# Patient Record
Sex: Female | Born: 1950 | Race: Black or African American | Hispanic: No
Health system: Southern US, Community
[De-identification: ages and names within clinical notes are randomized; demographics above are authoritative.]

---

## 2003-04-05 ENCOUNTER — Other Ambulatory Visit: Payer: Self-pay

## 2003-04-06 ENCOUNTER — Other Ambulatory Visit: Payer: Self-pay

## 2004-02-01 ENCOUNTER — Emergency Department: Payer: Self-pay | Admitting: Internal Medicine

## 2004-03-09 ENCOUNTER — Emergency Department: Payer: Self-pay | Admitting: Emergency Medicine

## 2004-03-09 ENCOUNTER — Other Ambulatory Visit: Payer: Self-pay

## 2004-03-10 ENCOUNTER — Ambulatory Visit: Payer: Self-pay | Admitting: Emergency Medicine

## 2004-09-10 ENCOUNTER — Emergency Department: Payer: Self-pay | Admitting: General Practice

## 2004-09-10 ENCOUNTER — Other Ambulatory Visit: Payer: Self-pay

## 2004-11-11 ENCOUNTER — Other Ambulatory Visit: Payer: Self-pay

## 2004-11-11 ENCOUNTER — Emergency Department: Payer: Self-pay | Admitting: Emergency Medicine

## 2005-04-25 ENCOUNTER — Ambulatory Visit: Payer: Self-pay | Admitting: Unknown Physician Specialty

## 2006-01-05 ENCOUNTER — Other Ambulatory Visit: Payer: Self-pay

## 2006-01-05 ENCOUNTER — Inpatient Hospital Stay: Payer: Self-pay | Admitting: Internal Medicine

## 2006-02-18 ENCOUNTER — Other Ambulatory Visit: Payer: Self-pay

## 2006-02-19 ENCOUNTER — Inpatient Hospital Stay: Payer: Self-pay | Admitting: Internal Medicine

## 2006-03-05 ENCOUNTER — Emergency Department: Payer: Self-pay | Admitting: Emergency Medicine

## 2007-04-29 IMAGING — CR DG CHEST 1V PORT
1 series · 1 of 1 positions shown · non-contrast
Comparison: none

REASON FOR EXAM: chest pain  m 13
COMMENTS:

[view not recorded]
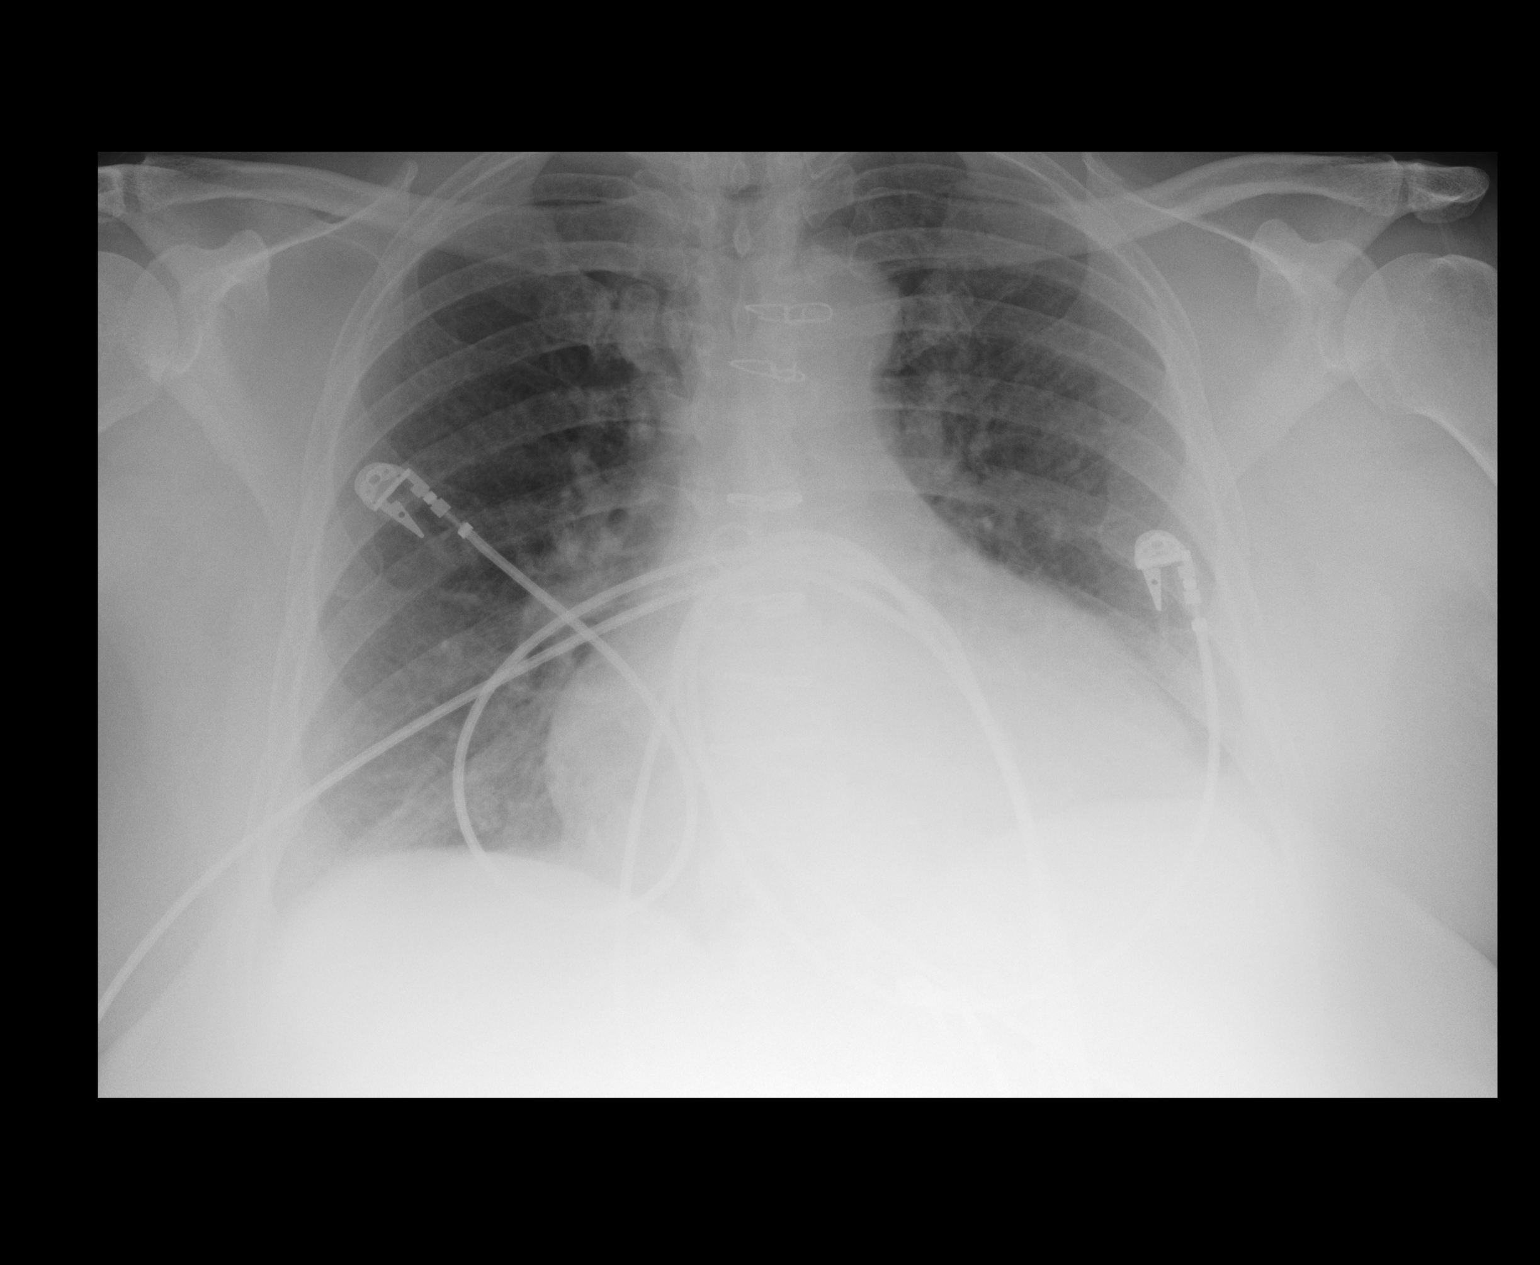

[1 of 1 positions shown; findings below may reference images not displayed]

PROCEDURE:     DXR - DXR PORTABLE CHEST SINGLE VIEW  - January 05, 2006  [DATE]

RESULT:     Comparison is made to prior study dated 11-11-04.

There is thickening and indistinctness of the interstitial markings and
pulmonary vascular as well as peribronchial cuffing.  The cardiac silhouette
is enlarged indicative of cardiomegaly. The visualized bony skeleton
demonstrates no evidence of fracture nor dislocation. The patient is status
post median sternotomy.
IMPRESSION: 1)Findings which appear to be consistent with pulmonary vascular
congestion/edema. No focal regions of consolidation are appreciated.

2)Cardiomegaly.

## 2014-08-11 ENCOUNTER — Other Ambulatory Visit: Payer: Self-pay

## 2014-08-11 ENCOUNTER — Emergency Department
Admission: EM | Admit: 2014-08-11 | Discharge: 2014-08-11 | Discharge status: Against Medical Advice | Payer: Medicare Other | Attending: Emergency Medicine | Admitting: Emergency Medicine

## 2014-08-11 DIAGNOSIS — Z880 Allergy status to penicillin: Secondary | ICD-10-CM | POA: Diagnosis not present

## 2014-08-11 DIAGNOSIS — W1839XA Other fall on same level, initial encounter: Secondary | ICD-10-CM | POA: Insufficient documentation

## 2014-08-11 DIAGNOSIS — W19XXXA Unspecified fall, initial encounter: Secondary | ICD-10-CM

## 2014-08-11 DIAGNOSIS — Y9389 Activity, other specified: Secondary | ICD-10-CM | POA: Diagnosis not present

## 2014-08-11 DIAGNOSIS — Z043 Encounter for examination and observation following other accident: Secondary | ICD-10-CM | POA: Insufficient documentation

## 2014-08-11 DIAGNOSIS — Y998 Other external cause status: Secondary | ICD-10-CM | POA: Diagnosis not present

## 2014-08-11 DIAGNOSIS — Y9289 Other specified places as the place of occurrence of the external cause: Secondary | ICD-10-CM | POA: Insufficient documentation

## 2014-08-11 LAB — TROPONIN I: Troponin I: 0.04 ng/mL — ABNORMAL HIGH (ref <0.031)

## 2014-08-11 LAB — CBC
HCT: 37.9 % (ref 35.0–47.0)
Hemoglobin: 12.4 g/dL (ref 12.0–16.0)
MCH: 30.3 pg (ref 26.0–34.0)
MCHC: 32.8 g/dL (ref 32.0–36.0)
MCV: 92.3 fL (ref 80.0–100.0)
PLATELETS: 206 10*3/uL (ref 150–440)
RBC: 4.1 MIL/uL (ref 3.80–5.20)
RDW: 17.2 % — AB (ref 11.5–14.5)
WBC: 12 10*3/uL — AB (ref 3.6–11.0)

## 2014-08-11 LAB — COMPREHENSIVE METABOLIC PANEL
ALK PHOS: 103 U/L (ref 38–126)
ALT: 26 U/L (ref 14–54)
AST: 25 U/L (ref 15–41)
Albumin: 3.7 g/dL (ref 3.5–5.0)
Anion gap: 8 (ref 5–15)
BILIRUBIN TOTAL: 0.5 mg/dL (ref 0.3–1.2)
BUN: 11 mg/dL (ref 6–20)
CO2: 28 mmol/L (ref 22–32)
Calcium: 6 mg/dL — CL (ref 8.9–10.3)
Chloride: 104 mmol/L (ref 101–111)
Creatinine, Ser: 1.16 mg/dL — ABNORMAL HIGH (ref 0.44–1.00)
GFR calc non Af Amer: 49 mL/min — ABNORMAL LOW (ref >60)
GFR, EST AFRICAN AMERICAN: 56 mL/min — AB (ref >60)
GLUCOSE: 129 mg/dL — AB (ref 65–99)
POTASSIUM: 3.4 mmol/L — AB (ref 3.5–5.1)
Sodium: 140 mmol/L (ref 135–145)
TOTAL PROTEIN: 7.7 g/dL (ref 6.5–8.1)

## 2014-08-11 NOTE — ED Notes (Signed)
Pt eloped, Dr. Deberah CastlePaduchoski notified, Dr. Demetrius CharityP called family on phone with them at this time, regarding Calcium and troponin results.   Pt states they will come back tomorrow.
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
## 2014-08-11 NOTE — ED Notes (Signed)
Lab called with critical results, Calcium 6.0, Troponin 0.04, notified Dr Clamp.

## 2014-08-11 NOTE — ED Provider Notes (Signed)
[facility] Emergency Department Provider Note  Time seen: 6:25 PM  I have reviewed the triage vital signs and the nursing notes.   HISTORY  Chief Complaint Fall    HPI Efraim KaufmannBrenda J Stirling is a 64 y.o. female who presents the emergency department after a fall. According to the patient she states she stepped outside, had felt somewhat weak and fell to the ground. According to family they state the patient tripped, and fell, but caught herself. The patient and family deny any any head injury. Patient denies any headache. Patient does take blood thinners, but denies any head impact or loss of consciousness. Patient states she was concerned so she came to the emergency department. She has since stated that she has gotten out of bed and walked without any issue, currently does not have any pain anywhere per the patient.     No past medical history on file.  There are no active problems to display for this patient.   No past surgical history on file.  No current outpatient prescriptions on file.  Allergies Penicillins  No family history on file.  Social History History  Substance Use Topics  . Smoking status: Not on file  . Smokeless tobacco: Not on file  . Alcohol Use: Not on file    Review of Systems Constitutional: Negative for fever. Cardiovascular: Negative for chest pain. Respiratory: Negative for shortness of breath. Gastrointestinal: Negative for abdominal pain Musculoskeletal: Negative for back pain. Negative for neck pain. Skin: Negative for rash. Neurological: Negative for headaches, focal weakness or numbness.  10-point ROS otherwise negative.  ____________________________________________   PHYSICAL EXAM:  VITAL SIGNS: ED Triage Vitals  Enc Vitals Group     BP 08/11/14 1510 154/99 mmHg     Pulse Rate 08/11/14 1510 88     Resp 08/11/14 1510 20     Temp 08/11/14 1510 98 F (36.7 C)     Temp Source 08/11/14 1510 Oral     SpO2  08/11/14 1510 100 %     Weight 08/11/14 1510 205 lb (92.987 kg)     Height 08/11/14 1510 5\' 1"  (1.549 m)     Head Cir --      Peak Flow --      Pain Score --      Pain Loc --      Pain Edu? --      Excl. in GC? --     Constitutional: Alert and oriented. Well appearing and in no distress. Eyes: Normal exam ENT   Head: Normocephalic and atraumatic. Cardiovascular: Normal rate, regular rhythm. No murmur Respiratory: Normal respiratory effort without tachypnea nor retractions. Breath sounds are clear and equal bilaterally. No wheezes/rales/rhonchi. Gastrointestinal: Soft and nontender. No distention.   Musculoskeletal: Nontender with normal range of motion in all extremities.  Neurologic:  Normal speech and language. No gross focal neurologic deficits Skin:  Skin is warm, dry and intact.  Psychiatric: Mood and affect are normal. Speech and behavior are normal.   ____________________________________________    EKG  EKG reviewed and interpreted by myself shows normal sinus rhythm at 82 bpm, narrow QRS, normal axis, normal intervals besides a QTC of 535, nonspecific ST changes but no ST elevations noted.  ____________________________________________    INITIAL IMPRESSION / ASSESSMENT AND PLAN / ED COURSE  Pertinent labs & imaging results that were available during my care of the patient were reviewed by me and considered in my medical decision making (see chart for details).  Patient presents the emergency department for a fall, near syncope versus mechanical fall. It is not clear at this time. We will check labs, and closely monitor in the emergency department.  Labs show an elevated white blood cell count of 12, calcium of 6.0, creatinine of 1.1, and a troponin of 0.04. We will watch the patient in the emergency department, and repeat a troponin 2 hours after the initial troponin was drawn.  I went to discuss the labs with the patient, and she had eloped. I called the  patient on her cell phone, and she answered stating that they had left the emergency department. I discussed with her that her calcium is extremely low, she states she is aware of this and she is seeing her doctor about it. I also discussed that her troponin was positive at 0.04 and recommended she return to the emergency department for repeat blood work. Sensation states she is not able to do that today, but will return to the emergency department if anything changes overnight, otherwise she states she will come tomorrow for a recheck of her lab work. Patient appears to have capacity, and understands the risks.  ____________________________________________   FINAL CLINICAL IMPRESSION(S) / ED DIAGNOSES  Thresa Ross, MD 08/11/14 [phone]

## 2014-08-11 NOTE — ED Notes (Signed)
This tech here got blood from pt left Vein AC not the artery
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
## 2016-01-25 DEATH — deceased
